# Patient Record
Sex: Female | Born: 1995 | Race: White | Hispanic: No | Marital: Single | Smoking: Never smoker
Health system: Southern US, Community
[De-identification: ages and names within clinical notes are randomized; demographics above are authoritative.]

## PROBLEM LIST (undated history)

## (undated) DIAGNOSIS — M419 Scoliosis, unspecified: Secondary | ICD-10-CM

## (undated) DIAGNOSIS — Z789 Other specified health status: Secondary | ICD-10-CM

## (undated) HISTORY — PX: NO PAST SURGERIES: SHX2092

## (undated) HISTORY — DX: Other specified health status: Z78.9

---
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
## 2016-06-06 ENCOUNTER — Encounter: Payer: Self-pay | Admitting: Obstetrics and Gynecology

## 2016-06-06 DIAGNOSIS — N926 Irregular menstruation, unspecified: Secondary | ICD-10-CM

## 2017-07-05 DIAGNOSIS — Z131 Encounter for screening for diabetes mellitus: Secondary | ICD-10-CM | POA: Diagnosis not present

## 2017-07-05 DIAGNOSIS — Z01419 Encounter for gynecological examination (general) (routine) without abnormal findings: Secondary | ICD-10-CM | POA: Diagnosis not present

## 2017-07-05 DIAGNOSIS — Z124 Encounter for screening for malignant neoplasm of cervix: Secondary | ICD-10-CM | POA: Diagnosis not present

## 2017-07-05 DIAGNOSIS — Z3042 Encounter for surveillance of injectable contraceptive: Secondary | ICD-10-CM | POA: Diagnosis not present

## 2017-07-05 DIAGNOSIS — Z136 Encounter for screening for cardiovascular disorders: Secondary | ICD-10-CM | POA: Diagnosis not present

## 2017-07-05 DIAGNOSIS — N760 Acute vaginitis: Secondary | ICD-10-CM | POA: Diagnosis not present

## 2017-07-17 DIAGNOSIS — W540XXA Bitten by dog, initial encounter: Secondary | ICD-10-CM | POA: Diagnosis not present

## 2017-07-17 DIAGNOSIS — Z23 Encounter for immunization: Secondary | ICD-10-CM | POA: Diagnosis not present

## 2017-07-17 DIAGNOSIS — S0125XA Open bite of nose, initial encounter: Secondary | ICD-10-CM | POA: Diagnosis not present

## 2017-07-26 DIAGNOSIS — N911 Secondary amenorrhea: Secondary | ICD-10-CM | POA: Diagnosis not present

## 2017-07-26 DIAGNOSIS — Z308 Encounter for other contraceptive management: Secondary | ICD-10-CM | POA: Diagnosis not present

## 2017-09-10 DIAGNOSIS — M62830 Muscle spasm of back: Secondary | ICD-10-CM | POA: Diagnosis not present

## 2017-09-10 DIAGNOSIS — M545 Low back pain: Secondary | ICD-10-CM | POA: Diagnosis not present

## 2017-09-10 DIAGNOSIS — Z6824 Body mass index (BMI) 24.0-24.9, adult: Secondary | ICD-10-CM | POA: Diagnosis not present

## 2017-09-30 DIAGNOSIS — N390 Urinary tract infection, site not specified: Secondary | ICD-10-CM | POA: Diagnosis not present

## 2017-09-30 DIAGNOSIS — N3090 Cystitis, unspecified without hematuria: Secondary | ICD-10-CM | POA: Diagnosis not present

## 2017-10-12 DIAGNOSIS — Z308 Encounter for other contraceptive management: Secondary | ICD-10-CM | POA: Diagnosis not present

## 2017-11-24 DIAGNOSIS — J029 Acute pharyngitis, unspecified: Secondary | ICD-10-CM | POA: Diagnosis not present

## 2017-11-24 DIAGNOSIS — R300 Dysuria: Secondary | ICD-10-CM | POA: Diagnosis not present

## 2017-11-24 DIAGNOSIS — R0981 Nasal congestion: Secondary | ICD-10-CM | POA: Diagnosis not present

## 2017-11-24 DIAGNOSIS — N390 Urinary tract infection, site not specified: Secondary | ICD-10-CM | POA: Diagnosis not present

## 2018-01-03 DIAGNOSIS — Z308 Encounter for other contraceptive management: Secondary | ICD-10-CM | POA: Diagnosis not present

## 2018-01-11 DIAGNOSIS — N12 Tubulo-interstitial nephritis, not specified as acute or chronic: Secondary | ICD-10-CM | POA: Diagnosis not present

## 2018-01-11 DIAGNOSIS — N10 Acute tubulo-interstitial nephritis: Secondary | ICD-10-CM | POA: Diagnosis not present

## 2018-01-11 DIAGNOSIS — Z885 Allergy status to narcotic agent status: Secondary | ICD-10-CM | POA: Diagnosis not present

## 2018-01-11 DIAGNOSIS — R109 Unspecified abdominal pain: Secondary | ICD-10-CM | POA: Diagnosis not present

## 2018-01-11 DIAGNOSIS — R7989 Other specified abnormal findings of blood chemistry: Secondary | ICD-10-CM | POA: Diagnosis not present

## 2018-01-11 DIAGNOSIS — R110 Nausea: Secondary | ICD-10-CM | POA: Diagnosis not present

## 2018-01-11 DIAGNOSIS — R509 Fever, unspecified: Secondary | ICD-10-CM | POA: Diagnosis not present

## 2018-02-13 DIAGNOSIS — R300 Dysuria: Secondary | ICD-10-CM | POA: Diagnosis not present

## 2018-02-13 DIAGNOSIS — N3001 Acute cystitis with hematuria: Secondary | ICD-10-CM | POA: Diagnosis not present

## 2018-02-28 DIAGNOSIS — M549 Dorsalgia, unspecified: Secondary | ICD-10-CM | POA: Diagnosis not present

## 2018-02-28 DIAGNOSIS — M419 Scoliosis, unspecified: Secondary | ICD-10-CM | POA: Diagnosis not present

## 2018-02-28 DIAGNOSIS — Z6823 Body mass index (BMI) 23.0-23.9, adult: Secondary | ICD-10-CM | POA: Diagnosis not present

## 2018-02-28 DIAGNOSIS — M40209 Unspecified kyphosis, site unspecified: Secondary | ICD-10-CM | POA: Diagnosis not present

## 2018-02-28 DIAGNOSIS — M791 Myalgia, unspecified site: Secondary | ICD-10-CM | POA: Diagnosis not present

## 2018-03-01 DIAGNOSIS — N390 Urinary tract infection, site not specified: Secondary | ICD-10-CM | POA: Diagnosis not present

## 2018-03-01 DIAGNOSIS — Z87448 Personal history of other diseases of urinary system: Secondary | ICD-10-CM | POA: Diagnosis not present

## 2018-03-29 DIAGNOSIS — Z308 Encounter for other contraceptive management: Secondary | ICD-10-CM | POA: Diagnosis not present

## 2018-05-08 DIAGNOSIS — L255 Unspecified contact dermatitis due to plants, except food: Secondary | ICD-10-CM | POA: Diagnosis not present

## 2018-06-03 DIAGNOSIS — R300 Dysuria: Secondary | ICD-10-CM | POA: Diagnosis not present

## 2018-06-20 DIAGNOSIS — R161 Splenomegaly, not elsewhere classified: Secondary | ICD-10-CM | POA: Diagnosis not present

## 2018-06-20 DIAGNOSIS — R188 Other ascites: Secondary | ICD-10-CM | POA: Diagnosis not present

## 2018-06-20 DIAGNOSIS — N3289 Other specified disorders of bladder: Secondary | ICD-10-CM | POA: Diagnosis not present

## 2018-06-20 DIAGNOSIS — N750 Cyst of Bartholin's gland: Secondary | ICD-10-CM | POA: Diagnosis not present

## 2018-06-20 DIAGNOSIS — K5900 Constipation, unspecified: Secondary | ICD-10-CM | POA: Diagnosis not present

## 2018-06-20 DIAGNOSIS — N909 Noninflammatory disorder of vulva and perineum, unspecified: Secondary | ICD-10-CM | POA: Diagnosis not present

## 2018-06-20 DIAGNOSIS — Z87448 Personal history of other diseases of urinary system: Secondary | ICD-10-CM | POA: Diagnosis not present

## 2018-06-20 DIAGNOSIS — N390 Urinary tract infection, site not specified: Secondary | ICD-10-CM | POA: Diagnosis not present

## 2018-06-24 DIAGNOSIS — R300 Dysuria: Secondary | ICD-10-CM | POA: Diagnosis not present

## 2018-06-24 DIAGNOSIS — N340 Urethral abscess: Secondary | ICD-10-CM | POA: Diagnosis not present

## 2018-07-04 DIAGNOSIS — R300 Dysuria: Secondary | ICD-10-CM | POA: Diagnosis not present

## 2018-07-11 DIAGNOSIS — Z3041 Encounter for surveillance of contraceptive pills: Secondary | ICD-10-CM | POA: Diagnosis not present

## 2018-07-11 DIAGNOSIS — N368 Other specified disorders of urethra: Secondary | ICD-10-CM | POA: Diagnosis not present

## 2018-08-13 DIAGNOSIS — N368 Other specified disorders of urethra: Secondary | ICD-10-CM | POA: Diagnosis not present

## 2018-08-13 DIAGNOSIS — N898 Other specified noninflammatory disorders of vagina: Secondary | ICD-10-CM | POA: Diagnosis not present

## 2018-09-03 DIAGNOSIS — R1030 Lower abdominal pain, unspecified: Secondary | ICD-10-CM | POA: Diagnosis not present

## 2018-09-03 DIAGNOSIS — N939 Abnormal uterine and vaginal bleeding, unspecified: Secondary | ICD-10-CM | POA: Diagnosis not present

## 2018-09-03 DIAGNOSIS — N938 Other specified abnormal uterine and vaginal bleeding: Secondary | ICD-10-CM | POA: Diagnosis not present

## 2018-09-03 DIAGNOSIS — R109 Unspecified abdominal pain: Secondary | ICD-10-CM | POA: Diagnosis not present

## 2018-09-03 DIAGNOSIS — Z793 Long term (current) use of hormonal contraceptives: Secondary | ICD-10-CM | POA: Diagnosis not present

## 2018-09-03 DIAGNOSIS — Z885 Allergy status to narcotic agent status: Secondary | ICD-10-CM | POA: Diagnosis not present

## 2018-12-10 DIAGNOSIS — N898 Other specified noninflammatory disorders of vagina: Secondary | ICD-10-CM | POA: Diagnosis not present

## 2019-01-20 DIAGNOSIS — S39012D Strain of muscle, fascia and tendon of lower back, subsequent encounter: Secondary | ICD-10-CM | POA: Diagnosis not present

## 2019-04-17 DIAGNOSIS — Z30015 Encounter for initial prescription of vaginal ring hormonal contraceptive: Secondary | ICD-10-CM | POA: Diagnosis not present

## 2019-05-15 DIAGNOSIS — N920 Excessive and frequent menstruation with regular cycle: Secondary | ICD-10-CM | POA: Diagnosis not present

## 2019-05-15 DIAGNOSIS — N926 Irregular menstruation, unspecified: Secondary | ICD-10-CM | POA: Diagnosis not present

## 2019-06-03 ENCOUNTER — Other Ambulatory Visit: Payer: Self-pay

## 2019-06-03 ENCOUNTER — Emergency Department (INDEPENDENT_AMBULATORY_CARE_PROVIDER_SITE_OTHER)
Admission: EM | Admit: 2019-06-03 | Discharge: 2019-06-03 | Disposition: A | Discharge status: Home / Self Care | Payer: BLUE CROSS/BLUE SHIELD | Source: Home / Self Care | Attending: Emergency Medicine | Admitting: Emergency Medicine

## 2019-06-03 ENCOUNTER — Encounter: Payer: Self-pay | Admitting: Emergency Medicine

## 2019-06-03 DIAGNOSIS — G8929 Other chronic pain: Secondary | ICD-10-CM | POA: Diagnosis not present

## 2019-06-03 DIAGNOSIS — M4120 Other idiopathic scoliosis, site unspecified: Secondary | ICD-10-CM

## 2019-06-03 DIAGNOSIS — M5440 Lumbago with sciatica, unspecified side: Secondary | ICD-10-CM

## 2019-06-03 HISTORY — DX: Scoliosis, unspecified: M41.9

## 2019-06-03 MED ORDER — MELOXICAM 7.5 MG PO TABS: 7.5000 mg | ORAL_TABLET | Freq: Every day | ORAL | 0 refills | Status: DC

## 2019-06-03 MED ORDER — CYCLOBENZAPRINE HCL 5 MG PO TABS: 5.0000 mg | ORAL_TABLET | Freq: Three times a day (TID) | ORAL | 0 refills | Status: DC | PRN

## 2019-06-03 NOTE — ED Provider Notes (Signed)
[facility]    CSN: [id number] Arrival date & time: 06/03/19  0957      History   Chief Complaint Chief Complaint  Patient presents with  . Back Pain    HPI Leslie Wagner is a 23 y.o. female.   HPI Patient presents with low back pain for the last 4 to 5 days.  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  She is on birth control.  Patient is not pregnant.  She tried ibuprofen for pain but did not have any relief.  Her pain is in the midportion of her back but does radiate into the backs of both legs with numbness into the left great toe. Past Medical History:  Diagnosis Date  . Scoliosis     Patient Active Problem List   Diagnosis Date Noted  . PARONYCHIA, GREAT TOE 11/11/2010    History reviewed. No pertinent surgical history.  OB History   No obstetric history on file.      Home Medications    Prior to Admission medications   Medication Sig Start Date End Date Taking? Authorizing Provider  acetaminophen (TYLENOL) 325 MG tablet Take 650 mg by mouth every 6 (six) hours as needed.   Yes [provider]  ibuprofen (ADVIL) 200 MG tablet Take 200 mg by mouth every 6 (six) hours as needed.   Yes [provider]  cyclobenzaprine (FLEXERIL) 5 MG tablet Take 1 tablet (5 mg total) by mouth 3 (three) times daily as needed for muscle spasms. 06/03/19   Darlyne Russian, MD  meloxicam (MOBIC) 7.5 MG tablet Take 1 tablet (7.5 mg total) by mouth daily. 06/03/19   Darlyne Russian, MD    Family History Family History  Adopted: Yes  Family history unknown: Yes    Social History Social History   Tobacco Use  . Smoking status: Never Smoker  . Smokeless tobacco: Never Used  Substance Use Topics  . Alcohol use: Never    Frequency: Never  . Drug use: Never     Allergies   Tramadol   Review of Systems Review of Systems  Constitutional: Negative.   HENT: Negative.   Genitourinary: Negative.    Musculoskeletal: Positive for back pain and gait problem.     Physical Exam Triage Vital Signs ED Triage Vitals  Enc Vitals Group     BP 06/03/19 1127 112/73     Pulse Rate 06/03/19 1127 68     Resp --      Temp 06/03/19 1127 98.3 F (36.8 C)     Temp Source 06/03/19 1127 Oral     SpO2 06/03/19 1127 99 %     Weight 06/03/19 1130 131 lb (59.4 kg)     Height 06/03/19 1130 5\' 4"  (1.626 m)     Head Circumference --      Peak Flow --      Pain Score 06/03/19 1127 5     Pain Loc --      Pain Edu? --      Excl. in [place]? --    No data found.  Updated Vital Signs BP 112/73 (BP Location: Right Arm)   Pulse 68   Temp 98.3 F (36.8 C) (Oral)   Ht 5\' 4"  (1.626 m)   Wt 59.4 kg   LMP 05/26/2019 (Approximate)   SpO2 99%   BMI 22.49 kg/m   Visual Acuity Right Eye Distance:   Left  Eye Distance:   Bilateral Distance:    Right Eye Near:   Left Eye Near:    Bilateral Near:     Physical Exam Constitutional:      Appearance: Normal appearance.  HENT:     Head: Normocephalic.  Neck:     Musculoskeletal: Normal range of motion and neck supple.  Musculoskeletal:     Comments: Patient has a scoliotic curve.  There is tenderness along the lower perithoracic and paralumbar muscles.  There is limited range of motion with twisting to the right and to the left.  Deep tendon reflexes are 2+ and symmetrical.  Straight leg raising is negative.  Motor strength is 5 out of 5.  Neurological:     Mental Status: She is alert.      UC Treatments / Results  Labs (all labs ordered are listed, but only abnormal results are displayed) Labs Reviewed - No data to display  EKG   Radiology No results found.  Procedures Procedures (including critical care time)  Medications Ordered in UC Medications - No data to display  Initial Impression / Assessment and Plan / UC Course  I have reviewed the triage vital signs and the nursing notes. Patient definitely has a scoliotic curve and pain  into the backs of both legs with numbness of the left great toe.  I did not repeat any plain films.  I do feel she may be a candidate for other imaging of her back.  Will refer to Dr.Thekenkandam for his evaluation.  Will treat with Mobic and Flexeril. Pertinent labs & imaging results that were available during my care of the patient were reviewed by me and considered in my medical decision making (see chart for details).      Final Clinical Impressions(s) / UC Diagnoses   Final diagnoses:  Chronic bilateral low back pain with sciatica, sciatica laterality unspecified  Scoliosis (and kyphoscoliosis), idiopathic     Discharge Instructions     Apply heat to your back followed by stretching. You can apply ice packs for pain. Make an appointment to see Dr. Benjamin Stainhekkekandam. You may take extra strength Tylenol 1 to 2 capsules twice a day. Do not take ibuprofen or Aleve while on your prescription medication. The muscle relaxant you will be on causes drowsiness so take this mainly at night. Take your meloxicam once a day.    ED Prescriptions    Medication Sig Dispense Auth. Provider   meloxicam (MOBIC) 7.5 MG tablet Take 1 tablet (7.5 mg total) by mouth daily. 14 tablet Collene Gobbleaub, Shaleigh Laubscher A, MD   cyclobenzaprine (FLEXERIL) 5 MG tablet Take 1 tablet (5 mg total) by mouth 3 (three) times daily as needed for muscle spasms. 30 tablet Collene Gobbleaub, Helane Briceno A, MD     Controlled Substance Prescriptions [place] Controlled Substance Registry consulted? Not Applicable   Collene Gobbleaub, Brandye Inthavong A, MD 06/03/19 1436

## 2019-06-03 NOTE — Discharge Instructions (Signed)
Apply heat to your back followed by stretching. You can apply ice packs for pain. Make an appointment to see Dr. Dianah Field. You may take extra strength Tylenol 1 to 2 capsules twice a day. Do not take ibuprofen or Aleve while on your prescription medication. The muscle relaxant you will be on causes drowsiness so take this mainly at night. Take your meloxicam once a day.

## 2019-06-03 NOTE — ED Triage Notes (Signed)
LBP radiates down both legs. She has scoliosis but has been told by other doctors it's not related. She has had x-rays and nothing has been found to cause her pain.

## 2019-06-04 ENCOUNTER — Ambulatory Visit (INDEPENDENT_AMBULATORY_CARE_PROVIDER_SITE_OTHER): Payer: BC Managed Care – PPO

## 2019-06-04 ENCOUNTER — Encounter: Payer: Self-pay | Admitting: Sports Medicine

## 2019-06-04 ENCOUNTER — Ambulatory Visit (INDEPENDENT_AMBULATORY_CARE_PROVIDER_SITE_OTHER): Payer: BLUE CROSS/BLUE SHIELD | Admitting: Sports Medicine

## 2019-06-04 DIAGNOSIS — M5416 Radiculopathy, lumbar region: Secondary | ICD-10-CM

## 2019-06-04 DIAGNOSIS — M5127 Other intervertebral disc displacement, lumbosacral region: Secondary | ICD-10-CM | POA: Diagnosis not present

## 2019-06-04 DIAGNOSIS — M545 Low back pain: Secondary | ICD-10-CM | POA: Diagnosis not present

## 2019-06-04 MED ORDER — PREDNISONE 50 MG PO TABS: ORAL_TABLET | ORAL | 0 refills | Status: DC

## 2019-06-04 MED ORDER — KETOROLAC TROMETHAMINE 30 MG/ML IJ SOLN
30.0000 mg | Freq: Once | INTRAMUSCULAR | Status: AC
  Administered 2019-06-04: 14:00:00 30 mg via INTRAMUSCULAR

## 2019-06-04 NOTE — Assessment & Plan Note (Signed)
Left L4 distribution radiculitis with numbness in the great toe. This is been present on and off for years, she is never had advanced imaging. We are going to repeat x-rays, lumbar spine flexion and extension views and get an MRI today considering failure of greater than 6 weeks of physician directed conservative measures. Continuing her severe gelling and morning stiffness we are also going to proceed with a full rheumatoid work-up. Toradol 30 intramuscular, 5 days of prednisone. Return to see me later this week to go over MRI results.

## 2019-06-04 NOTE — Progress Notes (Signed)
Subjective:    CC: Chronic low back pain  HPI:  For years this pleasant 23 year old female has had episodes of acute low back pain, localized in the midline of the low back with occasional radiation down both legs and thighs, and sometimes to the left great toe.  Severe, persistent.  She denies any bowel or bladder dysfunction, saddle numbness, constitutional symptoms, she has had anti-inflammatories, muscle relaxers without much improvement.  She is never had advanced imaging.  I reviewed the past medical history, family history, social history, surgical history, and allergies today and no changes were needed.  Please see the problem list section below in epic for further details.  Past Medical History: Past Medical History:  Diagnosis Date  . No pertinent past medical history   . Scoliosis    Past Surgical History: Past Surgical History:  Procedure Laterality Date  . NO PAST SURGERIES     Social History: Social History   Socioeconomic History  . Marital status: Single    Spouse name: Not on file  . Number of children: Not on file  . Years of education: Not on file  . Highest education level: Not on file  Occupational History  . Not on file  Social Needs  . Financial resource strain: Not on file  . Food insecurity    Worry: Not on file    Inability: Not on file  . Transportation needs    Medical: Not on file    Non-medical: Not on file  Tobacco Use  . Smoking status: Never Smoker  . Smokeless tobacco: Never Used  Substance and Sexual Activity  . Alcohol use: Never    Frequency: Never  . Drug use: Never  . Sexual activity: Not on file  Lifestyle  . Physical activity    Days per week: Not on file    Minutes per session: Not on file  . Stress: Not on file  Relationships  . Social Musicianconnections    Talks on phone: Not on file    Gets together: Not on file    Attends religious service: Not on file    Active member of club or organization: Not on file    Attends  meetings of clubs or organizations: Not on file    Relationship status: Not on file  Other Topics Concern  . Not on file  Social History Narrative  . Not on file   Family History: Family History  Adopted: Yes  Family history unknown: Yes   Allergies: Allergies  Allergen Reactions  . Tramadol    Medications: See med rec.  Review of Systems: No headache, visual changes, nausea, vomiting, diarrhea, constipation, dizziness, abdominal pain, skin rash, fevers, chills, night sweats, swollen lymph nodes, weight loss, chest pain, body aches, joint swelling, muscle aches, shortness of breath, mood changes, visual or auditory hallucinations.  Objective:    General: Well Developed, well nourished, and in no acute distress.  Neuro: Alert and oriented x3, extra-ocular muscles intact, sensation grossly intact.  HEENT: Normocephalic, atraumatic, pupils equal round reactive to light, neck supple, no masses, no lymphadenopathy, thyroid nonpalpable.  Skin: Warm and dry, no rashes noted.  Cardiac: Regular rate and rhythm, no murmurs rubs or gallops.  Respiratory: Clear to auscultation bilaterally. Not using accessory muscles, speaking in full sentences.  Abdominal: Soft, nontender, nondistended, positive bowel sounds, no masses, no organomegaly.  Back Exam:  Inspection: Walks with a hunched over posture Motion: Flexion 45 deg, Extension 45 deg, Side Bending to 45 deg bilaterally,  Rotation to 45 deg bilaterally  SLR laying: Negative  XSLR laying: Negative  Palpable tenderness: None. FABER: negative. Sensory change: Gross sensation intact to all lumbar and sacral dermatomes.  Reflexes: 2+ at both patellar tendons, 2+ at achilles tendons, Babinski's downgoing.  Strength at foot  Plantar-flexion: 5/5 Dorsi-flexion: 5/5 Eversion: 5/5 Inversion: 5/5  Leg strength  Quad: 5/5 Hamstring: 5/5 Hip flexor: 5/5 Hip abductors: 5/5  Gait unremarkable.  Impression and Recommendations:    The patient was  counselled, risk factors were discussed, anticipatory guidance given.  Left lumbar radiculitis Left L4 distribution radiculitis with numbness in the great toe. This is been present on and off for years, she is never had advanced imaging. We are going to repeat x-rays, lumbar spine flexion and extension views and get an MRI today considering failure of greater than 6 weeks of physician directed conservative measures. Continuing her severe gelling and morning stiffness we are also going to proceed with a full rheumatoid work-up. Toradol 30 intramuscular, 5 days of prednisone. Return to see me later this week to go over MRI results.   ___________________________________________ Gwen Her. Dianah Field, M.D., ABFM., CAQSM. Primary Care and Sports Medicine [facility]  Adjunct Professor of [employer] [facility] [employer]

## 2019-06-05 NOTE — Addendum Note (Signed)
Addended by: Silverio Decamp on: 06/05/2019 11:53 AM   Modules accepted: Orders

## 2019-06-06 ENCOUNTER — Ambulatory Visit (INDEPENDENT_AMBULATORY_CARE_PROVIDER_SITE_OTHER): Payer: BLUE CROSS/BLUE SHIELD | Admitting: Sports Medicine

## 2019-06-06 ENCOUNTER — Other Ambulatory Visit: Payer: Self-pay

## 2019-06-06 ENCOUNTER — Encounter: Payer: Self-pay | Admitting: Sports Medicine

## 2019-06-06 DIAGNOSIS — M5416 Radiculopathy, lumbar region: Secondary | ICD-10-CM

## 2019-06-06 NOTE — Progress Notes (Signed)
  Subjective:    CC: Follow-up  HPI: Leslie Wagner returns, she is improved considerably with Toradol, and her first dose of prednisone, we did obtain an MRI that showed multilevel DDD, early for age for a 23 year old.  I reviewed the past medical history, family history, social history, surgical history, and allergies today and no changes were needed.  Please see the problem list section below in epic for further details.  Past Medical History: Past Medical History:  Diagnosis Date  . No pertinent past medical history   . Scoliosis    Past Surgical History: Past Surgical History:  Procedure Laterality Date  . NO PAST SURGERIES     Social History: Social History   Socioeconomic History  . Marital status: Single    Spouse name: Not on file  . Number of children: Not on file  . Years of education: Not on file  . Highest education level: Not on file  Occupational History  . Not on file  Social Needs  . Financial resource strain: Not on file  . Food insecurity    Worry: Not on file    Inability: Not on file  . Transportation needs    Medical: Not on file    Non-medical: Not on file  Tobacco Use  . Smoking status: Never Smoker  . Smokeless tobacco: Never Used  Substance and Sexual Activity  . Alcohol use: Never    Frequency: Never  . Drug use: Never  . Sexual activity: Not on file  Lifestyle  . Physical activity    Days per week: Not on file    Minutes per session: Not on file  . Stress: Not on file  Relationships  . Social Herbalist on phone: Not on file    Gets together: Not on file    Attends religious service: Not on file    Active member of club or organization: Not on file    Attends meetings of clubs or organizations: Not on file    Relationship status: Not on file  Other Topics Concern  . Not on file  Social History Narrative  . Not on file   Family History: Family History  Adopted: Yes  Family history unknown: Yes   Allergies: Allergies   Allergen Reactions  . Tramadol    Medications: See med rec.  Review of Systems: No fevers, chills, night sweats, weight loss, chest pain, or shortness of breath.   Objective:    General: Well Developed, well nourished, and in no acute distress.  Neuro: Alert and oriented x3, extra-ocular muscles intact, sensation grossly intact.  HEENT: Normocephalic, atraumatic, pupils equal round reactive to light, neck supple, no masses, no lymphadenopathy, thyroid nonpalpable.  Skin: Warm and dry, no rashes. Cardiac: Regular rate and rhythm, no murmurs rubs or gallops, no lower extremity edema.  Respiratory: Clear to auscultation bilaterally. Not using accessory muscles, speaking in full sentences.  Impression and Recommendations:    Left lumbar radiculitis Two-level lumbar DDD as expected. Continue prednisone. Already feeling much better. I do think we should continue with 6 weeks of formal physical therapy before considering interventional treatment.    ___________________________________________ Gwen Her. Dianah Field, M.D., ABFM., CAQSM. Primary Care and Sports Medicine [facility]  Adjunct Professor of [employer] [facility] [employer]

## 2019-06-06 NOTE — Assessment & Plan Note (Signed)
Two-level lumbar DDD as expected. Continue prednisone. Already feeling much better. I do think we should continue with 6 weeks of formal physical therapy before considering interventional treatment.

## 2019-06-09 ENCOUNTER — Encounter: Payer: Self-pay | Admitting: Sports Medicine

## 2019-06-09 LAB — RHEUMATOID ARTHRITIS DIAGNOSTIC PANEL, COMPREHENSIVE
Cyclic Citrullin Peptide Ab: <16 Units (ref <20)
Rheumatoid Factor (IgA): <5 U (ref <=6)
Rheumatoid Factor (IgG): <5 U (ref <=6)
Rheumatoid Factor (IgM): <5 U (ref <=6)
SSA (Ro) (ENA) Antibody, IgG: <1 AI
SSB (La) (ENA) Antibody, IgG: <1 AI

## 2019-06-09 LAB — CBC WITH DIFFERENTIAL/PLATELET
Absolute Monocytes: 413 cells/uL (ref 200–950)
Basophils Absolute: 30 cells/uL (ref 0–200)
Basophils Relative: 0.4 %
Eosinophils Absolute: 83 cells/uL (ref 15–500)
Eosinophils Relative: 1.1 %
HCT: 38.4 % (ref 35.0–45.0)
Hemoglobin: 12.4 g/dL (ref 11.7–15.5)
Lymphs Abs: 2363 cells/uL (ref 850–3900)
MCH: 26.6 pg — ABNORMAL LOW (ref 27.0–33.0)
MCHC: 32.3 g/dL (ref 32.0–36.0)
MCV: 82.4 fL (ref 80.0–100.0)
MPV: 11.2 fL (ref 7.5–12.5)
Monocytes Relative: 5.5 %
Neutro Abs: 4613 cells/uL (ref 1500–7800)
Neutrophils Relative %: 61.5 %
Platelets: 207 10*3/uL (ref 140–400)
RBC: 4.66 10*6/uL (ref 3.80–5.10)
RDW: 12.9 % (ref 11.0–15.0)
Total Lymphocyte: 31.5 %
WBC: 7.5 10*3/uL (ref 3.8–10.8)

## 2019-06-09 LAB — COMPREHENSIVE METABOLIC PANEL
AG Ratio: 1.5 (calc) (ref 1.0–2.5)
ALT: 10 U/L (ref 6–29)
AST: 43 U/L — ABNORMAL HIGH (ref 10–30)
Albumin: 4.3 g/dL (ref 3.6–5.1)
Alkaline phosphatase (APISO): 32 U/L (ref 31–125)
BUN: 19 mg/dL (ref 7–25)
CO2: 25 mmol/L (ref 20–32)
Calcium: 9.4 mg/dL (ref 8.6–10.2)
Chloride: 106 mmol/L (ref 98–110)
Creat: 0.73 mg/dL (ref 0.50–1.10)
Globulin: 2.8 g/dL (calc) (ref 1.9–3.7)
Glucose, Bld: 73 mg/dL (ref 65–99)
Potassium: 3.8 mmol/L (ref 3.5–5.3)
Sodium: 138 mmol/L (ref 135–146)
Total Bilirubin: 0.4 mg/dL (ref 0.2–1.2)
Total Protein: 7.1 g/dL (ref 6.1–8.1)

## 2019-06-09 LAB — CK: Total CK: 84 U/L (ref 29–143)

## 2019-06-09 LAB — HLA-B27 ANTIGEN: HLA-B27 Antigen: NEGATIVE

## 2019-06-09 LAB — ANA, IFA COMPREHENSIVE PANEL
Anti Nuclear Antibody (ANA): POSITIVE — AB
ENA SM Ab Ser-aCnc: <1 AI
SM/RNP: <1 AI
SSA (Ro) (ENA) Antibody, IgG: <1 AI
SSB (La) (ENA) Antibody, IgG: <1 AI
Scleroderma (Scl-70) (ENA) Antibody, IgG: <1 AI
ds DNA Ab: <1 IU/mL

## 2019-06-09 LAB — ANTI-NUCLEAR AB-TITER (ANA TITER)
ANA TITER: 1:40 {titer} — ABNORMAL HIGH
ANA Titer 1: 1:80 {titer} — ABNORMAL HIGH

## 2019-06-09 LAB — URIC ACID: Uric Acid, Serum: 3.7 mg/dL (ref 2.5–7.0)

## 2019-06-09 LAB — SEDIMENTATION RATE: Sed Rate: 2 mm/h (ref 0–20)

## 2019-06-16 ENCOUNTER — Encounter: Payer: Self-pay | Admitting: Sports Medicine

## 2019-06-17 ENCOUNTER — Encounter: Payer: Self-pay | Admitting: Sports Medicine

## 2019-06-17 DIAGNOSIS — M5416 Radiculopathy, lumbar region: Secondary | ICD-10-CM

## 2019-06-17 MED ORDER — GABAPENTIN 300 MG PO CAPS: ORAL_CAPSULE | ORAL | 3 refills | Status: DC

## 2019-06-18 ENCOUNTER — Ambulatory Visit: Payer: BLUE CROSS/BLUE SHIELD | Admitting: Rehabilitative and Restorative Service Providers"

## 2019-07-08 DIAGNOSIS — M545 Low back pain: Secondary | ICD-10-CM | POA: Diagnosis not present

## 2019-07-08 DIAGNOSIS — M6281 Muscle weakness (generalized): Secondary | ICD-10-CM | POA: Diagnosis not present

## 2019-07-11 DIAGNOSIS — M6281 Muscle weakness (generalized): Secondary | ICD-10-CM | POA: Diagnosis not present

## 2019-07-11 DIAGNOSIS — M545 Low back pain: Secondary | ICD-10-CM | POA: Diagnosis not present

## 2019-07-14 DIAGNOSIS — M6281 Muscle weakness (generalized): Secondary | ICD-10-CM | POA: Diagnosis not present

## 2019-07-14 DIAGNOSIS — M545 Low back pain: Secondary | ICD-10-CM | POA: Diagnosis not present

## 2019-07-16 DIAGNOSIS — M545 Low back pain: Secondary | ICD-10-CM | POA: Diagnosis not present

## 2019-07-16 DIAGNOSIS — M6281 Muscle weakness (generalized): Secondary | ICD-10-CM | POA: Diagnosis not present

## 2019-07-18 ENCOUNTER — Ambulatory Visit: Payer: BLUE CROSS/BLUE SHIELD | Admitting: Sports Medicine

## 2019-07-21 DIAGNOSIS — M6281 Muscle weakness (generalized): Secondary | ICD-10-CM | POA: Diagnosis not present

## 2019-07-21 DIAGNOSIS — M545 Low back pain: Secondary | ICD-10-CM | POA: Diagnosis not present

## 2019-07-23 DIAGNOSIS — M6281 Muscle weakness (generalized): Secondary | ICD-10-CM | POA: Diagnosis not present

## 2019-07-23 DIAGNOSIS — M545 Low back pain: Secondary | ICD-10-CM | POA: Diagnosis not present

## 2019-08-04 DIAGNOSIS — M6281 Muscle weakness (generalized): Secondary | ICD-10-CM | POA: Diagnosis not present

## 2019-08-04 DIAGNOSIS — M545 Low back pain: Secondary | ICD-10-CM | POA: Diagnosis not present

## 2019-08-06 DIAGNOSIS — M545 Low back pain: Secondary | ICD-10-CM | POA: Diagnosis not present

## 2019-08-06 DIAGNOSIS — M6281 Muscle weakness (generalized): Secondary | ICD-10-CM | POA: Diagnosis not present

## 2020-11-07 IMAGING — MR MR LUMBAR SPINE W/O CM
4 of 5 series · 26 of 48 positions shown · non-contrast
Comparison: Lumbar radiographs, same date.

CLINICAL DATA: Severe back pain. Failed conservative measures.
Numbness in left foot and left great toe.

EXAM:
MRI LUMBAR SPINE WITHOUT CONTRAST
TECHNIQUE: Multiplanar, multisequence MR imaging of the lumbar spine was
performed. No intravenous contrast was administered.

[Series 5: T2 · sagittal · 4.0mm · 0.81mm/px · 6 of 15 slices shown (1 of 2)]
[im 1/15]
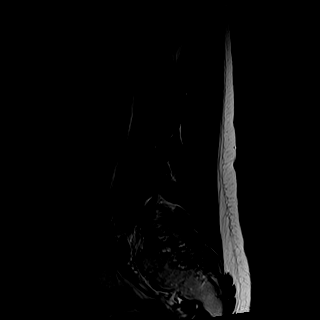
[im 3/15]
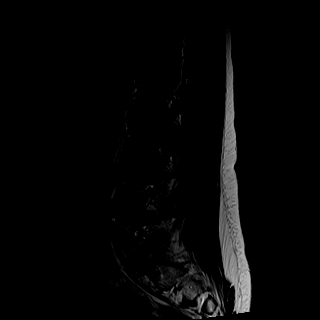
[im 6/15]
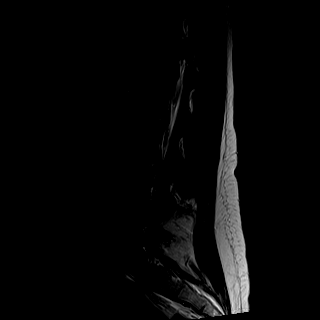
[im 9/15]
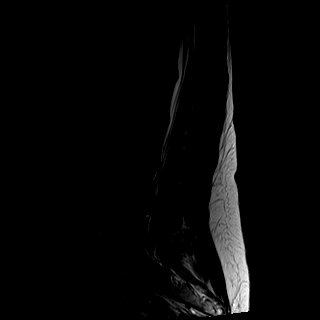
[im 12/15]
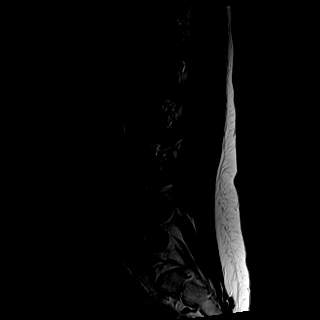
[im 15/15]
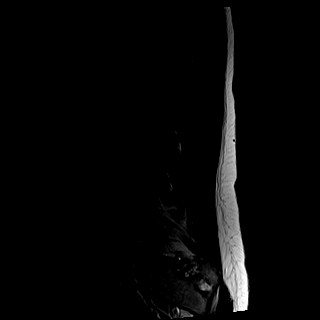

[Series 6: T1 · sagittal · 4.0mm · 0.41mm/px · 6 of 15 slices shown (1 of 2)]
[im 1/15]
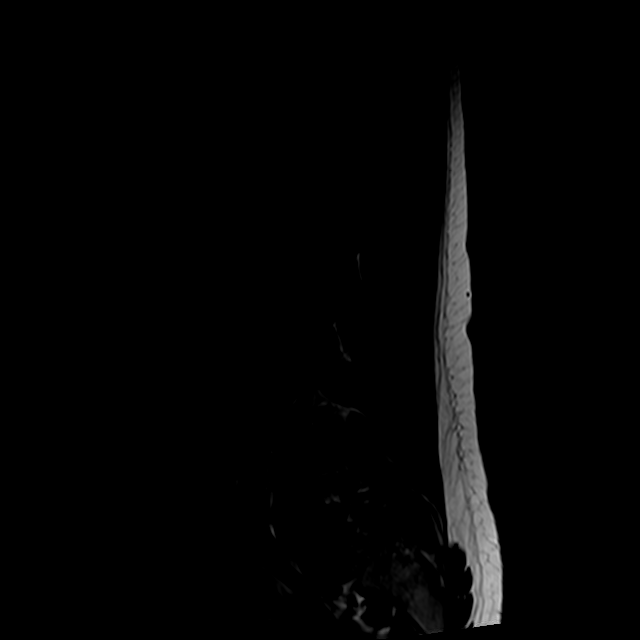
[im 3/15]
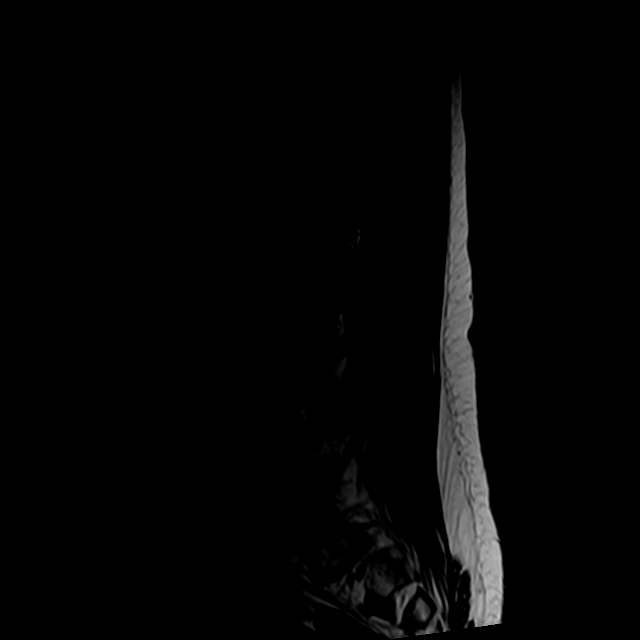
[im 6/15]
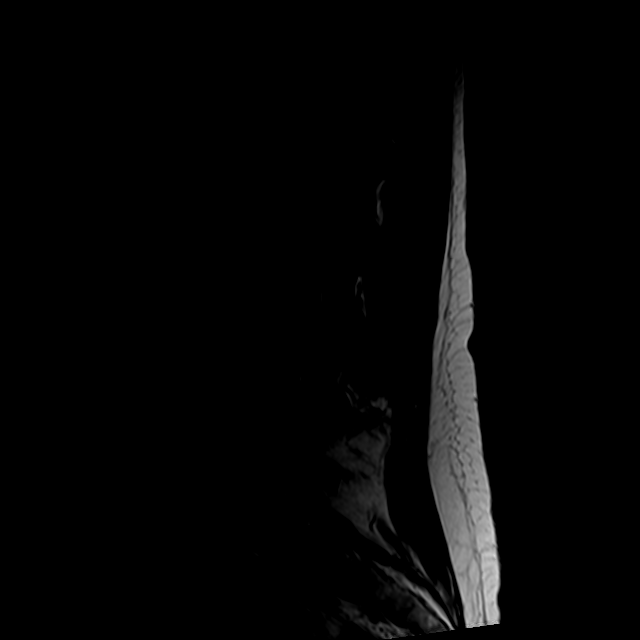
[im 9/15]
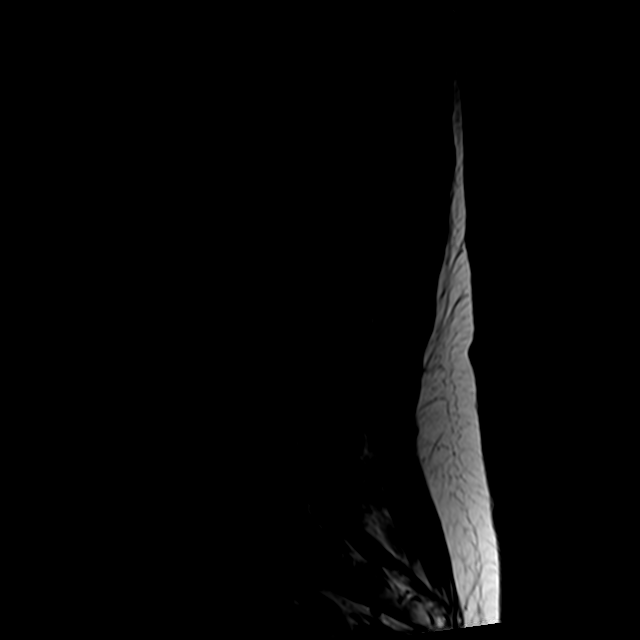
[im 12/15]
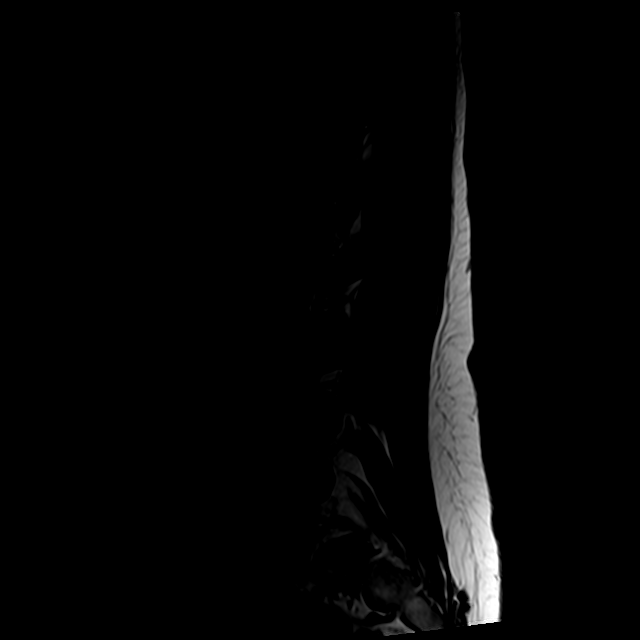
[im 15/15]
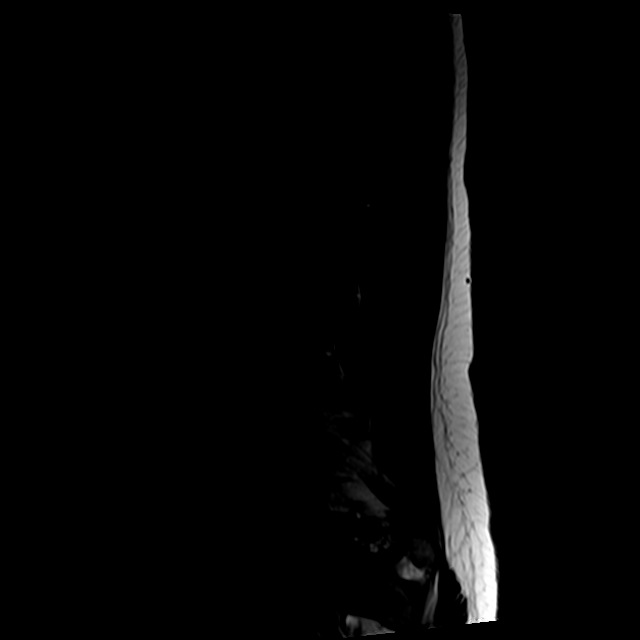

[Series 8: T2 · axial · 4.0mm · 0.78mm/px · z∈[-139,+82]mm · 9 of 41 slices shown (2 of 2)]
[im 1/41]
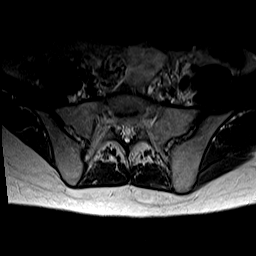
[im 6/41]
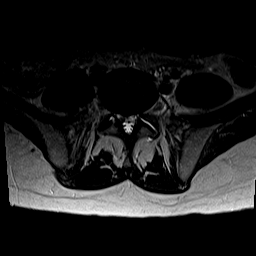
[im 12/41]
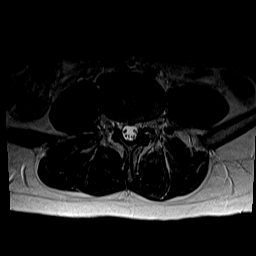
[im 18/41]
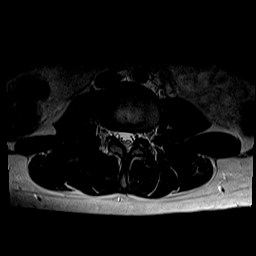
[im 21/41]
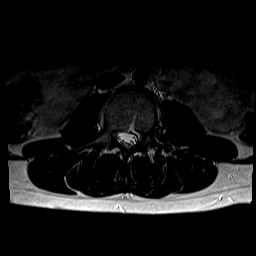
[im 23/41]
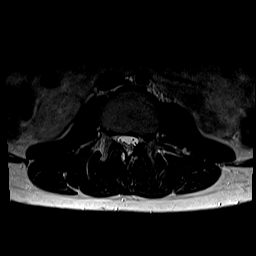
[im 29/41]
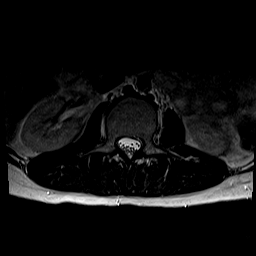
[im 35/41]
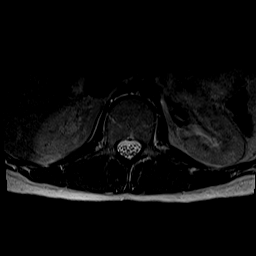
[im 41/41]
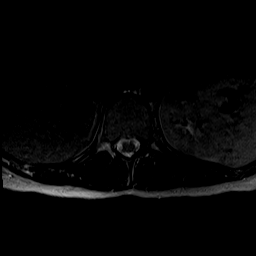

[Series 9: T1 · axial · 4.0mm · 0.39mm/px · z∈[-139,+52]mm · 5 of 41 slices shown (2 of 2)]
[im 1/41]
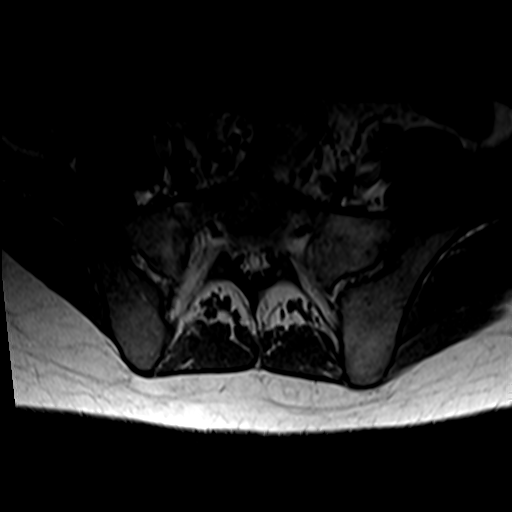
[im 6/41]
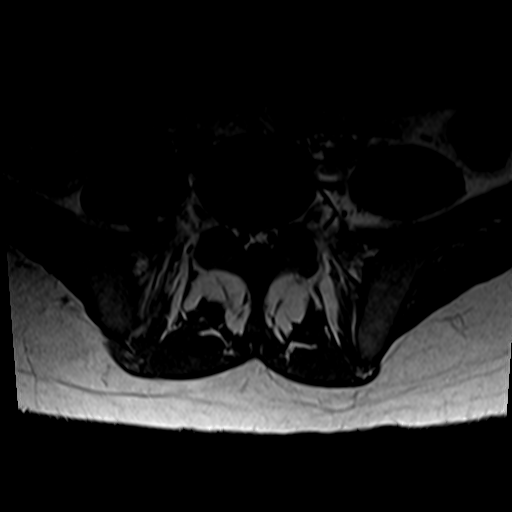
[im 12/41]
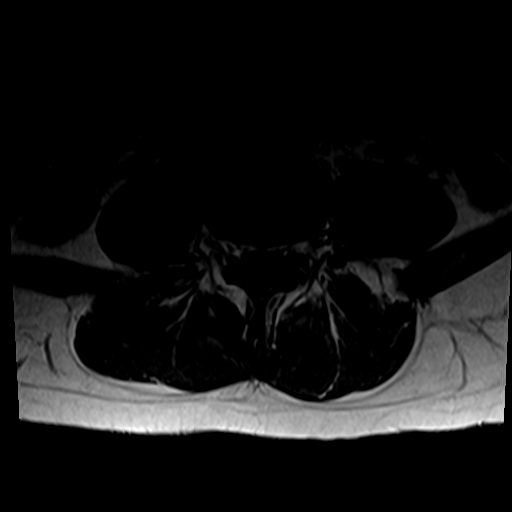
[im 21/41]
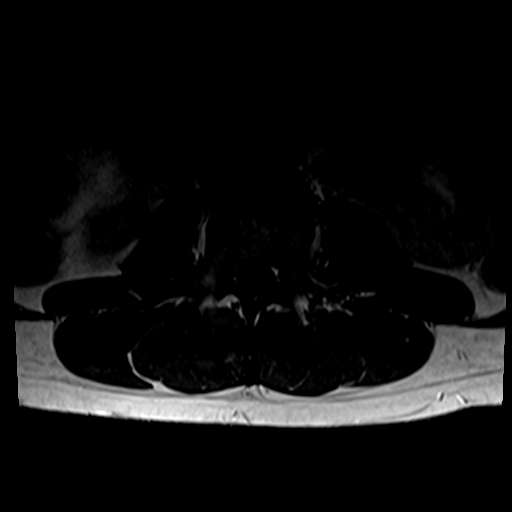
[im 35/41]
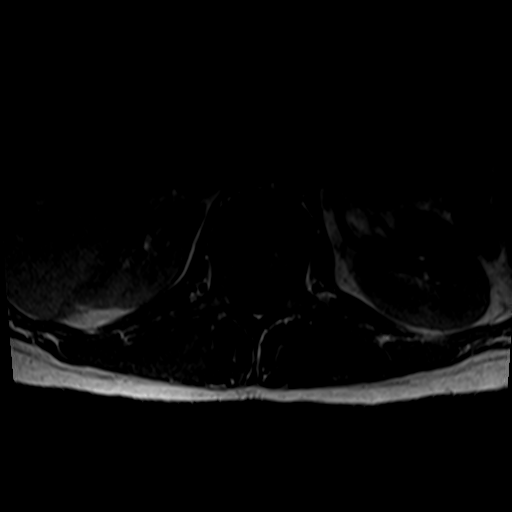

[26 of 48 positions shown; findings below may reference images not displayed]

FINDINGS: Segmentation: There are five lumbar type vertebral bodies. The last
full intervertebral disc space is labeled L5-S1. This correlates
with the lumbar radiographs.

Alignment: Mild straightening of the normal lumbar lordosis but the
vertebral bodies are normally aligned. The facets are also normally
aligned. No pars defects are identified.

Vertebrae:  Normal marrow signal.  No bone lesions or fractures.

Conus medullaris and cauda equina: Conus extends to the L1 level.
Conus and cauda equina appear normal.

Paraspinal and other soft tissues: No significant findings.

Disc levels:

T12-L1: Small right-sided annular fissure and tiny disc protrusion.
No significant mass effect.

L1-2: Disc space narrowing but no disc protrusion, spinal or
foraminal stenosis.

L2-3: Mild/early facet disease no disc protrusions, spinal or
foraminal stenosis.

L3-4: No disc protrusions, spinal or foraminal stenosis.

L4-5: Disc desiccation. Shallow central disc protrusion with mild
impression on the ventral thecal sac. No lateral recess or foraminal
stenosis.

L5-S1: Mild facet disease with fluid in both facet joints, left
greater than right. Shallow central disc protrusion but no definite
neural compression. No foraminal stenosis.
IMPRESSION: 1. Annular fissure and tiny right right sided disc protrusion at
T12-L1 but no significant neural compression.
2. Shallow disc protrusions at L4-5 and L5-S1 without significant
spinal or foraminal stenosis.
3. Mild age advanced facet disease.  No definite pars defects.

## 2022-04-20 ENCOUNTER — Emergency Department (HOSPITAL_COMMUNITY): Payer: Medicaid Other

## 2022-04-20 ENCOUNTER — Other Ambulatory Visit: Payer: Self-pay

## 2022-04-20 ENCOUNTER — Encounter (HOSPITAL_COMMUNITY): Payer: Self-pay

## 2022-04-20 ENCOUNTER — Observation Stay (HOSPITAL_COMMUNITY)
Admission: EM | Admit: 2022-04-20 | Discharge: 2022-04-21 | Disposition: A | Discharge status: Home / Self Care | Payer: Medicaid Other | Attending: Surgery | Admitting: Surgery

## 2022-04-20 DIAGNOSIS — Y9389 Activity, other specified: Secondary | ICD-10-CM | POA: Insufficient documentation

## 2022-04-20 DIAGNOSIS — M25512 Pain in left shoulder: Secondary | ICD-10-CM | POA: Diagnosis not present

## 2022-04-20 DIAGNOSIS — Y92410 Unspecified street and highway as the place of occurrence of the external cause: Secondary | ICD-10-CM | POA: Diagnosis not present

## 2022-04-20 DIAGNOSIS — R519 Headache, unspecified: Secondary | ICD-10-CM | POA: Diagnosis not present

## 2022-04-20 DIAGNOSIS — R1012 Left upper quadrant pain: Secondary | ICD-10-CM | POA: Diagnosis present

## 2022-04-20 DIAGNOSIS — S36039A Unspecified laceration of spleen, initial encounter: Principal | ICD-10-CM | POA: Insufficient documentation

## 2022-04-20 DIAGNOSIS — Z79899 Other long term (current) drug therapy: Secondary | ICD-10-CM | POA: Diagnosis not present

## 2022-04-20 DIAGNOSIS — M542 Cervicalgia: Secondary | ICD-10-CM | POA: Diagnosis not present

## 2022-04-20 LAB — CBC WITH DIFFERENTIAL/PLATELET
Abs Immature Granulocytes: 0.09 10*3/uL — ABNORMAL HIGH (ref 0.00–0.07)
Basophils Absolute: 0 10*3/uL (ref 0.0–0.1)
Basophils Relative: 0 %
Eosinophils Absolute: 0 10*3/uL (ref 0.0–0.5)
Eosinophils Relative: 0 %
HCT: 30.9 % — ABNORMAL LOW (ref 36.0–46.0)
Hemoglobin: 9.3 g/dL — ABNORMAL LOW (ref 12.0–15.0)
Immature Granulocytes: 1 %
Lymphocytes Relative: 12 %
Lymphs Abs: 1.5 10*3/uL (ref 0.7–4.0)
MCH: 23.3 pg — ABNORMAL LOW (ref 26.0–34.0)
MCHC: 30.1 g/dL (ref 30.0–36.0)
MCV: 77.3 fL — ABNORMAL LOW (ref 80.0–100.0)
Monocytes Absolute: 0.8 10*3/uL (ref 0.1–1.0)
Monocytes Relative: 6 %
Neutro Abs: 10 10*3/uL — ABNORMAL HIGH (ref 1.7–7.7)
Neutrophils Relative %: 81 %
Platelets: 182 10*3/uL (ref 150–400)
RBC: 4 MIL/uL (ref 3.87–5.11)
RDW: 14.3 % (ref 11.5–15.5)
WBC: 12.4 10*3/uL — ABNORMAL HIGH (ref 4.0–10.5)
nRBC: 0 % (ref 0.0–0.2)

## 2022-04-20 LAB — I-STAT BETA HCG BLOOD, ED (MC, WL, AP ONLY): I-stat hCG, quantitative: <5 m[IU]/mL (ref <5)

## 2022-04-20 LAB — HIV ANTIBODY (ROUTINE TESTING W REFLEX): HIV Screen 4th Generation wRfx: NONREACTIVE

## 2022-04-20 LAB — TYPE AND SCREEN
ABO/RH(D): B POS
Antibody Screen: NEGATIVE

## 2022-04-20 LAB — BASIC METABOLIC PANEL
Anion gap: 7 (ref 5–15)
BUN: 8 mg/dL (ref 6–20)
CO2: 28 mmol/L (ref 22–32)
Calcium: 9.1 mg/dL (ref 8.9–10.3)
Chloride: 104 mmol/L (ref 98–111)
Creatinine, Ser: 0.75 mg/dL (ref 0.44–1.00)
GFR, Estimated: >60 mL/min (ref >60)
Glucose, Bld: 126 mg/dL — ABNORMAL HIGH (ref 70–99)
Potassium: 4.3 mmol/L (ref 3.5–5.1)
Sodium: 139 mmol/L (ref 135–145)

## 2022-04-20 MED ORDER — METHOCARBAMOL 500 MG PO TABS
500.0000 mg | ORAL_TABLET | Freq: Three times a day (TID) | ORAL | Status: DC | PRN
  Administered 2022-04-20: 500 mg via ORAL
  Filled 2022-04-20: qty 1

## 2022-04-20 MED ORDER — MORPHINE SULFATE (PF) 2 MG/ML IV SOLN: 2.0000 mg | INTRAVENOUS | Status: DC | PRN

## 2022-04-20 MED ORDER — IOHEXOL 300 MG/ML  SOLN
100.0000 mL | Freq: Once | INTRAMUSCULAR | Status: AC | PRN
  Administered 2022-04-20: 100 mL via INTRAVENOUS

## 2022-04-20 MED ORDER — DOCUSATE SODIUM 100 MG PO CAPS
100.0000 mg | ORAL_CAPSULE | Freq: Two times a day (BID) | ORAL | Status: DC
  Administered 2022-04-20 – 2022-04-21 (×2): 100 mg via ORAL
  Filled 2022-04-20 (×2): qty 1

## 2022-04-20 MED ORDER — ONDANSETRON HCL 4 MG/2ML IJ SOLN
4.0000 mg | Freq: Four times a day (QID) | INTRAMUSCULAR | Status: DC | PRN
  Administered 2022-04-21: 4 mg via INTRAVENOUS
  Filled 2022-04-20: qty 2

## 2022-04-20 MED ORDER — IBUPROFEN 400 MG PO TABS
ORAL_TABLET | ORAL | Status: AC
  Filled 2022-04-20: qty 1

## 2022-04-20 MED ORDER — IBUPROFEN 200 MG PO TABS
ORAL_TABLET | ORAL | Status: AC
  Filled 2022-04-20: qty 1

## 2022-04-20 MED ORDER — IOHEXOL 350 MG/ML SOLN
50.0000 mL | Freq: Once | INTRAVENOUS | Status: AC | PRN
  Administered 2022-04-20: 50 mL via INTRAVENOUS

## 2022-04-20 MED ORDER — ENOXAPARIN SODIUM 30 MG/0.3ML IJ SOSY
30.0000 mg | PREFILLED_SYRINGE | Freq: Two times a day (BID) | INTRAMUSCULAR | Status: DC
Start: 2022-04-22 — End: 2022-04-21

## 2022-04-20 MED ORDER — ACETAMINOPHEN 325 MG PO TABS
650.0000 mg | ORAL_TABLET | ORAL | Status: DC | PRN
  Administered 2022-04-20: 650 mg via ORAL
  Filled 2022-04-20: qty 2

## 2022-04-20 MED ORDER — SODIUM CHLORIDE 0.9 % IV SOLN
INTRAVENOUS | Status: DC
Start: 2022-04-20 — End: 2022-04-21

## 2022-04-20 MED ORDER — MELATONIN 3 MG PO TABS: 3.0000 mg | ORAL_TABLET | Freq: Every evening | ORAL | Status: DC | PRN

## 2022-04-20 MED ORDER — METHOCARBAMOL 1000 MG/10ML IJ SOLN
500.0000 mg | Freq: Three times a day (TID) | INTRAVENOUS | Status: DC | PRN
  Filled 2022-04-20: qty 5

## 2022-04-20 MED ORDER — OXYCODONE HCL 5 MG PO TABS
5.0000 mg | ORAL_TABLET | ORAL | Status: DC | PRN
  Administered 2022-04-20 – 2022-04-21 (×2): 10 mg via ORAL
  Filled 2022-04-20 (×2): qty 2

## 2022-04-20 MED ORDER — HYDRALAZINE HCL 20 MG/ML IJ SOLN: 10.0000 mg | INTRAMUSCULAR | Status: DC | PRN

## 2022-04-20 MED ORDER — ONDANSETRON 4 MG PO TBDP: 4.0000 mg | ORAL_TABLET | Freq: Four times a day (QID) | ORAL | Status: DC | PRN

## 2022-04-20 MED ORDER — IBUPROFEN 400 MG PO TABS
600.0000 mg | ORAL_TABLET | Freq: Once | ORAL | Status: AC
  Administered 2022-04-20: 600 mg via ORAL

## 2022-04-20 NOTE — ED Notes (Signed)
Patient returned from CT at this time via wheelchair. Pt alert and talking. No distress noted.

## 2022-04-20 NOTE — ED Notes (Signed)
Report given to [facility], RN and pt transported via wheelchair to room 30 adult ED without difficulty.

## 2022-04-20 NOTE — ED Notes (Signed)
Called 6N at this time. Unable to connect with receiving nurse. Awaiting purpleman Icon to give report.

## 2022-04-20 NOTE — ED Notes (Addendum)
Patient taken by wheelchair to CT. Pt alert and oriented in c collar. No new bruising or pain noted. Family at bs.

## 2022-04-20 NOTE — ED Notes (Signed)
Transport to be put in for patient - floor notified  
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
## 2022-04-21 ENCOUNTER — Other Ambulatory Visit (HOSPITAL_COMMUNITY): Payer: Self-pay

## 2022-04-21 LAB — BASIC METABOLIC PANEL
Anion gap: 8 (ref 5–15)
BUN: 12 mg/dL (ref 6–20)
CO2: 24 mmol/L (ref 22–32)
Calcium: 9 mg/dL (ref 8.9–10.3)
Chloride: 106 mmol/L (ref 98–111)
Creatinine, Ser: 0.85 mg/dL (ref 0.44–1.00)
GFR, Estimated: >60 mL/min (ref >60)
Glucose, Bld: 91 mg/dL (ref 70–99)
Potassium: 4.2 mmol/L (ref 3.5–5.1)
Sodium: 138 mmol/L (ref 135–145)

## 2022-04-21 LAB — CBC
HCT: 28.9 % — ABNORMAL LOW (ref 36.0–46.0)
Hemoglobin: 8.8 g/dL — ABNORMAL LOW (ref 12.0–15.0)
MCH: 23.5 pg — ABNORMAL LOW (ref 26.0–34.0)
MCHC: 30.4 g/dL (ref 30.0–36.0)
MCV: 77.1 fL — ABNORMAL LOW (ref 80.0–100.0)
Platelets: 145 10*3/uL — ABNORMAL LOW (ref 150–400)
RBC: 3.75 MIL/uL — ABNORMAL LOW (ref 3.87–5.11)
RDW: 14.5 % (ref 11.5–15.5)
WBC: 8.8 10*3/uL (ref 4.0–10.5)
nRBC: 0 % (ref 0.0–0.2)

## 2022-04-21 MED ORDER — ACETAMINOPHEN 500 MG PO TABS: 1000.0000 mg | ORAL_TABLET | Freq: Four times a day (QID) | ORAL | Status: AC | PRN

## 2022-04-21 MED ORDER — OXYCODONE HCL 10 MG PO TABS
10.0000 mg | ORAL_TABLET | Freq: Four times a day (QID) | ORAL | 0 refills | Status: DC | PRN
Start: 2022-04-21 — End: 2022-04-21
  Filled 2022-04-21: qty 25, 7d supply, fill #0

## 2022-04-21 MED ORDER — OXYCODONE HCL 10 MG PO TABS
10.0000 mg | ORAL_TABLET | Freq: Four times a day (QID) | ORAL | 0 refills | Status: AC | PRN
  Filled 2022-04-21: qty 15, 4d supply, fill #0

## 2022-04-21 MED ORDER — DOCUSATE SODIUM 100 MG PO CAPS
100.0000 mg | ORAL_CAPSULE | Freq: Two times a day (BID) | ORAL | 0 refills | Status: AC
  Filled 2022-04-21: qty 10, 5d supply, fill #0

## 2022-04-21 MED ORDER — METHOCARBAMOL 500 MG PO TABS
500.0000 mg | ORAL_TABLET | Freq: Three times a day (TID) | ORAL | 0 refills | Status: AC | PRN
  Filled 2022-04-21: qty 40, 14d supply, fill #0

## 2022-04-21 NOTE — TOC CAGE-AID Note (Signed)
Transition of Care [facility]) - CAGE-AID Screening   Patient Details  Name: Leslie Wagner MRN: [id number] Date of Birth: 1996-06-21  Transition of Care [facility]) CM/SW Contact:    Coralee Pesa, [place] Phone Number: 04/21/2022, 12:49 PM   Clinical Narrative:  CSW met with pt at bedside to complete CAGE- AID assessment. Pt denies any drug or alcohol use, no resources given.  CAGE-AID Screening:    Have You Ever Felt You Ought to Cut Down on Your Drinking or Drug Use?: No Have People Annoyed You By Critizing Your Drinking Or Drug Use?: No Have You Felt Bad Or Guilty About Your Drinking Or Drug Use?: No Have You Ever Had a Drink or Used Drugs First Thing In The Morning to Steady Your Nerves or to Get Rid of a Hangover?: No CAGE-AID Score: 0  Substance Abuse Education Offered: No
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
